# Patient Record
Sex: Male | Born: 1995 | Race: Black or African American | Hispanic: No | Marital: Single | Smoking: Never smoker
Health system: Southern US, Community
[De-identification: ages and names within clinical notes are randomized; demographics above are authoritative.]

---
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
## 2021-02-24 ENCOUNTER — Encounter (HOSPITAL_COMMUNITY): Payer: Self-pay | Admitting: Medical Oncology

## 2021-02-24 ENCOUNTER — Ambulatory Visit (INDEPENDENT_AMBULATORY_CARE_PROVIDER_SITE_OTHER): Payer: Self-pay

## 2021-02-24 ENCOUNTER — Other Ambulatory Visit: Payer: Self-pay

## 2021-02-24 ENCOUNTER — Ambulatory Visit (HOSPITAL_COMMUNITY)
Admission: EM | Admit: 2021-02-24 | Discharge: 2021-02-24 | Disposition: A | Discharge status: Home / Self Care | Payer: Self-pay | Attending: Internal Medicine | Admitting: Internal Medicine

## 2021-02-24 DIAGNOSIS — S63287A Dislocation of proximal interphalangeal joint of left little finger, initial encounter: Secondary | ICD-10-CM

## 2021-02-24 DIAGNOSIS — S6992XA Unspecified injury of left wrist, hand and finger(s), initial encounter: Secondary | ICD-10-CM

## 2021-02-24 DIAGNOSIS — S63259A Unspecified dislocation of unspecified finger, initial encounter: Secondary | ICD-10-CM

## 2021-02-24 DIAGNOSIS — Y9379 Activity, other specified sports and athletics: Secondary | ICD-10-CM

## 2021-02-24 MED ORDER — NAPROXEN 500 MG PO TABS: 500.0000 mg | ORAL_TABLET | Freq: Two times a day (BID) | ORAL | 0 refills | Status: DC

## 2021-02-24 MED ORDER — MORPHINE SULFATE (PF) 2 MG/ML IV SOLN: 4.0000 mg | Freq: Once | INTRAVENOUS | Status: DC

## 2021-02-24 NOTE — ED Provider Notes (Signed)
[facility]    CSN: [id number] Arrival date & time: 02/24/21  1740      History   Chief Complaint Chief Complaint  Patient presents with  . Finger Injury    LT small finger    HPI Jack Rubio is a 25 y.o. male.   HPI   Finger Injury: Patient reports that about 15 minutes ago he was playing basketball when he jammed his left pinky finger.  Immediately had 10 out of 10 pain and noticed that his pinky finger did not look aligned.  He has not tried anything for symptoms.  No previous injury to this area.  History reviewed. No pertinent past medical history.  There are no problems to display for this patient.   History reviewed. No pertinent surgical history.     Home Medications    Prior to Admission medications   Not on File    Family History History reviewed. No pertinent family history.  Social History     Allergies   Patient has no known allergies.   Review of Systems Review of Systems  As stated above in HPI Physical Exam Triage Vital Signs ED Triage Vitals  Enc Vitals Group     BP 02/24/21 1756 138/68     Pulse Rate 02/24/21 1756 (!) 102     Resp 02/24/21 1756 18     Temp 02/24/21 1756 98.2 F (36.8 C)     Temp Source 02/24/21 1756 Oral     SpO2 02/24/21 1756 99 %     Weight --      Height --      Head Circumference --      Peak Flow --      Pain Score 02/24/21 1753 4     Pain Loc --      Pain Edu? --      Excl. in GC? --    No data found.  Updated Vital Signs BP 138/68 (BP Location: Right Arm)   Pulse (!) 102   Temp 98.2 F (36.8 C) (Oral)   Resp 18   SpO2 99%   Physical Exam Vitals and nursing note reviewed.  Constitutional:      Appearance: Normal appearance.  Musculoskeletal:     Comments: The left 5th digit is malaligned at the PIP joint.  There is no ecchymosis, edema, skin breakdown or pallor.  Skin:    General: Skin is warm.     Capillary Refill: Capillary refill takes less than 2 seconds.   Neurological:     General: No focal deficit present.     Mental Status: He is alert.     Sensory: No sensory deficit.      UC Treatments / Results  Labs (all labs ordered are listed, but only abnormal results are displayed) Labs Reviewed - No data to display  EKG   Radiology No results found.  Procedures Procedures (including critical care time)  Medications Ordered in UC Medications  morphine 2 MG/ML injection 4 mg (has no administration in time range)    Initial Impression / Assessment and Plan / UC Course  I have reviewed the triage vital signs and the nursing notes.  Pertinent labs & imaging results that were available during my care of the patient were reviewed by me and considered in my medical decision making (see chart for details).     New.  Obtained an image which did confirm a dislocation without any obvious fracture.  We discussed that there is a  higher likelihood of fracture with relocation given the type of dislocation that he has.  It is also possible that the fracture may be hidden given the dislocation. we discussed the procedure with patient and I offered him morphine prior to the procedure however he elected to trial without morphine first.  Gentle traction was applied to the distal finger and the finger relocated into proper alignment.  Patient had full range of motion.  He denied any decrease in sensation, any increase in pain and his capillary refill was less than 2 seconds after procedure.  Updated x-ray pending and he will be splinted.  If a fracture is shown he will need to be seen by orthopedics for follow up and he will keep the splint with buddy tape on for 4-6 weeks.    Final Clinical Impressions(s) / UC Diagnoses   Final diagnoses:  None   Discharge Instructions   None    ED Prescriptions    None     PDMP not reviewed this encounter.   Rushie Chestnut, [place] 02/24/21 1824

## 2021-02-24 NOTE — ED Triage Notes (Signed)
Pt presents with injury to Lt small finger.

## 2021-02-24 NOTE — Discharge Instructions (Addendum)
Please wear the splint until you are seen by orthopedics but likely for 4-6 weeks.

## 2021-04-07 ENCOUNTER — Other Ambulatory Visit: Payer: Self-pay

## 2021-04-07 ENCOUNTER — Encounter: Payer: Self-pay | Admitting: Emergency Medicine

## 2021-04-07 ENCOUNTER — Emergency Department
Admission: EM | Admit: 2021-04-07 | Discharge: 2021-04-07 | Disposition: A | Discharge status: Home / Self Care | Payer: Self-pay | Source: Home / Self Care

## 2021-04-07 DIAGNOSIS — R202 Paresthesia of skin: Secondary | ICD-10-CM

## 2021-04-07 DIAGNOSIS — Z113 Encounter for screening for infections with a predominantly sexual mode of transmission: Secondary | ICD-10-CM

## 2021-04-07 LAB — POCT URINALYSIS DIP (MANUAL ENTRY)
Bilirubin, UA: NEGATIVE
Blood, UA: NEGATIVE
Glucose, UA: NEGATIVE mg/dL
Ketones, POC UA: NEGATIVE mg/dL
Leukocytes, UA: NEGATIVE
Nitrite, UA: NEGATIVE
Protein Ur, POC: NEGATIVE mg/dL
Spec Grav, UA: 1.01 (ref 1.010–1.025)
Urobilinogen, UA: 0.2 E.U./dL
pH, UA: 6.5 (ref 5.0–8.0)

## 2021-04-07 NOTE — Discharge Instructions (Signed)
We have drawn blood for lab testing. These results will be available via MyChart. We will also call you with any abnormal results that require treatment.  Urine testing will be back in about 2-3 days.  Do not have sex until results are back and negative or until treatment is completed, whichever is longer.  Follow up with this office or with primary care if symptoms are persisting.  Follow up in the ER for high fever, trouble swallowing, trouble breathing, other concerning symptoms.

## 2021-04-07 NOTE — ED Provider Notes (Signed)
[facility]    CSN: [id number] Arrival date & time: 04/07/21  1116      History   Chief Complaint Chief Complaint  Patient presents with  . Possible STD    HPI Jack Rubio is a 25 y.o. male.   Reports that he is feeling tingling at the tip of his penis. Reports in the past that he has also had a painless sore to the end of his urethra. Denies any current lesions. Reports recent unprotected sex with one male partner. Reports that he did experience some burning to his penis after sex with this partner. Declines current burning, dysuria, hematuria, penile discharge, fever, chills, abdominal pain, other symptoms. Denies previous symptoms. Denies being told of known exposure to STI.  ROS per HPI  The history is provided by the patient.    History reviewed. No pertinent past medical history.  There are no problems to display for this patient.   History reviewed. No pertinent surgical history.     Home Medications    Prior to Admission medications   Medication Sig Start Date End Date Taking? Authorizing Provider  naproxen (NAPROSYN) 500 MG tablet Take 1 tablet (500 mg total) by mouth 2 (two) times daily. 02/24/21   Rushie Chestnut, PA-C    Family History No family history on file.  Social History Social History   Tobacco Use  . Smoking status: Never Smoker  . Smokeless tobacco: Never Used     Allergies   Patient has no known allergies.   Review of Systems Review of Systems   Physical Exam Triage Vital Signs ED Triage Vitals  Enc Vitals Group     BP 04/07/21 1131 108/69     Pulse Rate 04/07/21 1131 75     Resp --      Temp --      Temp src --      SpO2 04/07/21 1131 99 %     Weight --      Height --      Head Circumference --      Peak Flow --      Pain Score 04/07/21 1132 0     Pain Loc --      Pain Edu? --      Excl. in GC? --    No data found.  Updated Vital Signs BP 108/69 (BP Location: Left Arm)   Pulse 75   SpO2  99%   Visual Acuity Right Eye Distance:   Left Eye Distance:   Bilateral Distance:    Right Eye Near:   Left Eye Near:    Bilateral Near:     Physical Exam Vitals and nursing note reviewed.  Constitutional:      General: He is not in acute distress.    Appearance: Normal appearance. He is well-developed. He is not ill-appearing.  HENT:     Head: Normocephalic and atraumatic.  Eyes:     Extraocular Movements: Extraocular movements intact.     Conjunctiva/sclera: Conjunctivae normal.     Pupils: Pupils are equal, round, and reactive to light.  Cardiovascular:     Rate and Rhythm: Normal rate and regular rhythm.     Heart sounds: No murmur heard.   Pulmonary:     Effort: Pulmonary effort is normal. No respiratory distress.     Breath sounds: Normal breath sounds.  Abdominal:     Palpations: Abdomen is soft.     Tenderness: There is no abdominal tenderness.  Genitourinary:  Comments: Deferred  Musculoskeletal:        General: Normal range of motion.     Cervical back: Normal range of motion and neck supple.  Skin:    General: Skin is warm and dry.     Capillary Refill: Capillary refill takes less than 2 seconds.  Neurological:     General: No focal deficit present.     Mental Status: He is alert and oriented to person, place, and time.  Psychiatric:        Mood and Affect: Mood normal.        Behavior: Behavior normal.        Thought Content: Thought content normal.      UC Treatments / Results  Labs (all labs ordered are listed, but only abnormal results are displayed) Labs Reviewed  C. TRACHOMATIS/N. GONORRHOEAE RNA  RPR  HIV ANTIBODY (ROUTINE TESTING W REFLEX)  HSV(HERPES SIMPLEX VRS) I + II AB-IGG  POCT URINALYSIS DIP (MANUAL ENTRY)    EKG   Radiology No results found.  Procedures Procedures (including critical care time)  Medications Ordered in UC Medications - No data to display  Initial Impression / Assessment and Plan / UC Course  I  have reviewed the triage vital signs and the nursing notes.  Pertinent labs & imaging results that were available during my care of the patient were reviewed by me and considered in my medical decision making (see chart for details).    Tingling Sensation to penis STI Screen  Urine GC/Chlamydia obtained UA negative for infection HIV/RPR/HSV drawn in office today Will be in touch with any abnormal results that require treatment Do not have sex until results are back and negative or until treatment is completed, whichever is longer Follow up with this office or with primary care if symptoms are persisting.  Follow up in the ER for high fever, trouble swallowing, trouble breathing, other concerning symptoms.   Final Clinical Impressions(s) / UC Diagnoses   Final diagnoses:  Routine screening for STI (sexually transmitted infection)  Tingling sensation     Discharge Instructions     We have drawn blood for lab testing. These results will be available via MyChart. We will also call you with any abnormal results that require treatment.  Urine testing will be back in about 2-3 days.  Do not have sex until results are back and negative or until treatment is completed, whichever is longer.  Follow up with this office or with primary care if symptoms are persisting.  Follow up in the ER for high fever, trouble swallowing, trouble breathing, other concerning symptoms.     ED Prescriptions    None     PDMP not reviewed this encounter.   Moshe Cipro, NP 04/07/21 1257

## 2021-04-07 NOTE — ED Triage Notes (Signed)
Patient feels that he has a possible STD.  He is having "tingling" feeling.  Patient is not having any dysuria or discharge.  Patient has had both protected and unprotected sex with 2 different partners.  STD testing in December, never received results.

## 2021-04-08 LAB — HIV ANTIBODY (ROUTINE TESTING W REFLEX): HIV 1&2 Ab, 4th Generation: NONREACTIVE

## 2021-04-08 LAB — C. TRACHOMATIS/N. GONORRHOEAE RNA
C. trachomatis RNA, TMA: NOT DETECTED
N. gonorrhoeae RNA, TMA: NOT DETECTED

## 2021-04-08 LAB — HSV(HERPES SIMPLEX VRS) I + II AB-IGG
HAV 1 IGG,TYPE SPECIFIC AB: 10.9 index — ABNORMAL HIGH
HSV 2 IGG,TYPE SPECIFIC AB: <0.9 index

## 2021-04-08 LAB — RPR: RPR Ser Ql: NONREACTIVE

## 2022-04-09 IMAGING — DX DG FINGER LITTLE 2+V*L*
3 series · 3 of 3 positions shown · non-contrast
Comparison: None.

CLINICAL DATA: Sports injury

EXAM:
LEFT LITTLE FINGER 2+V

[finger ap]
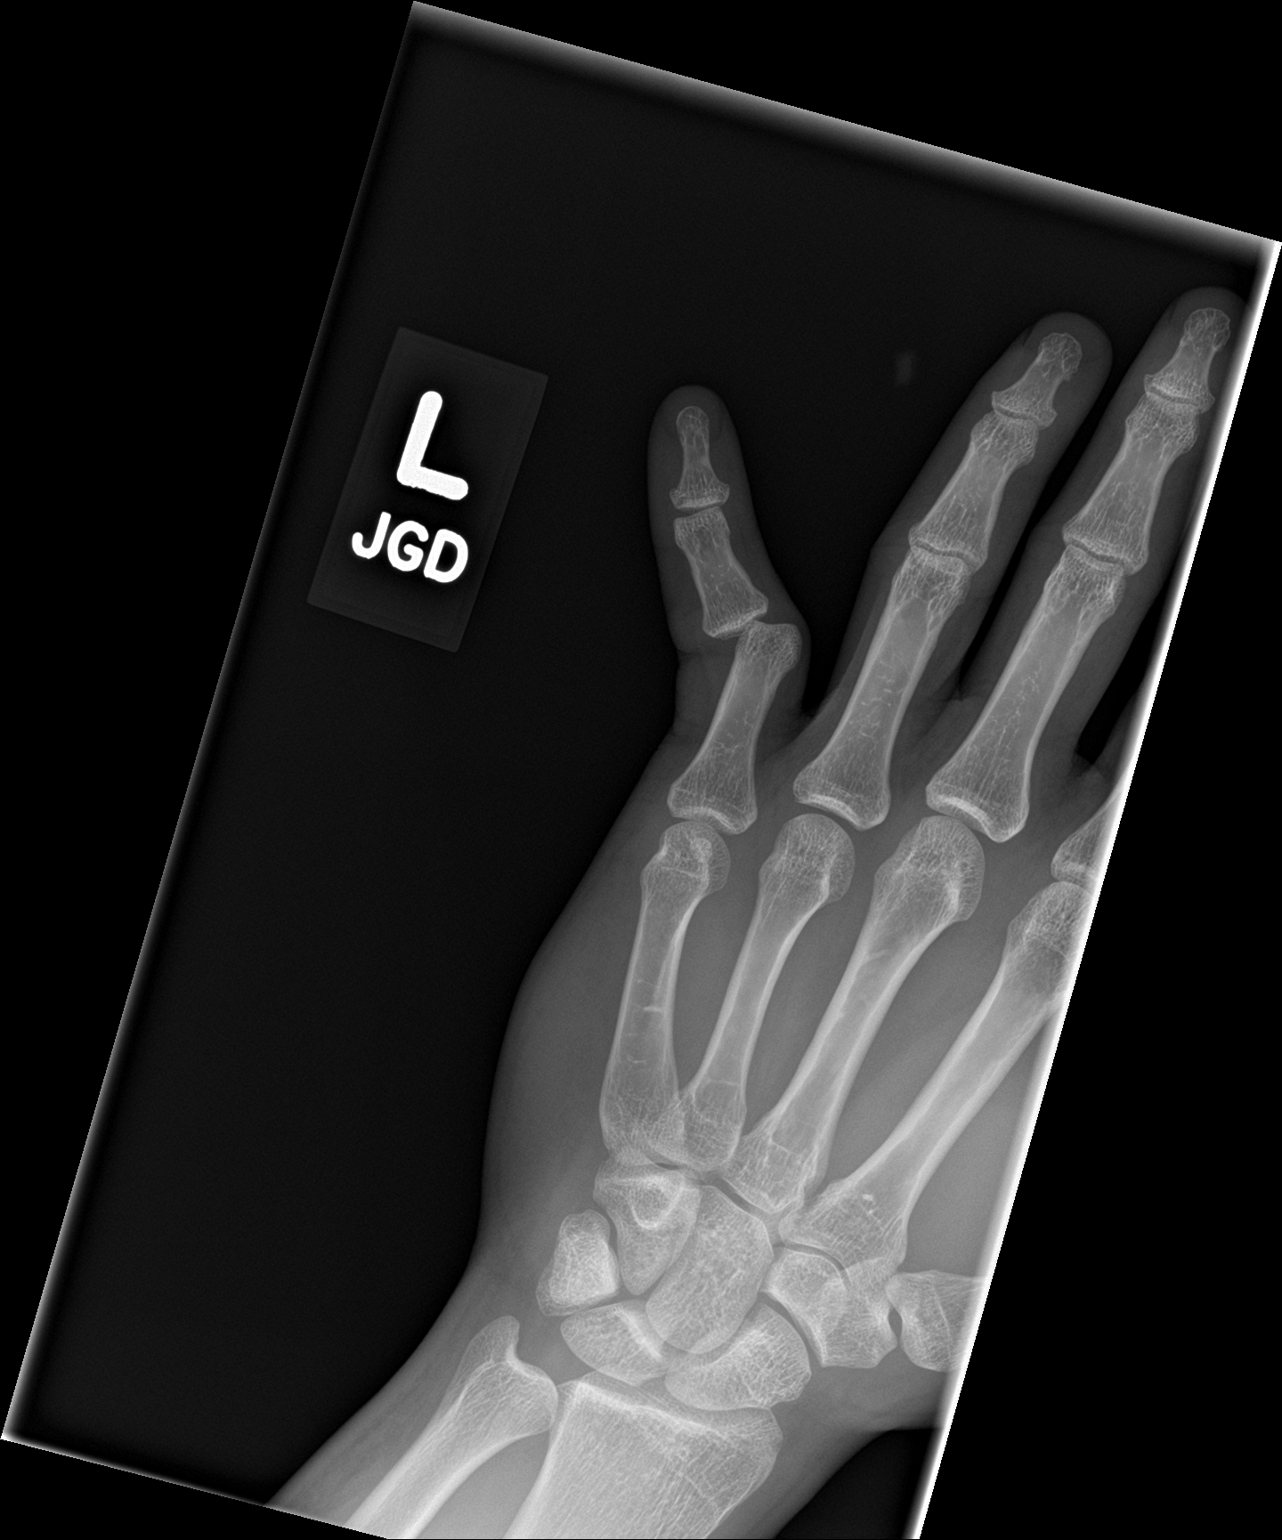

[finger obl]
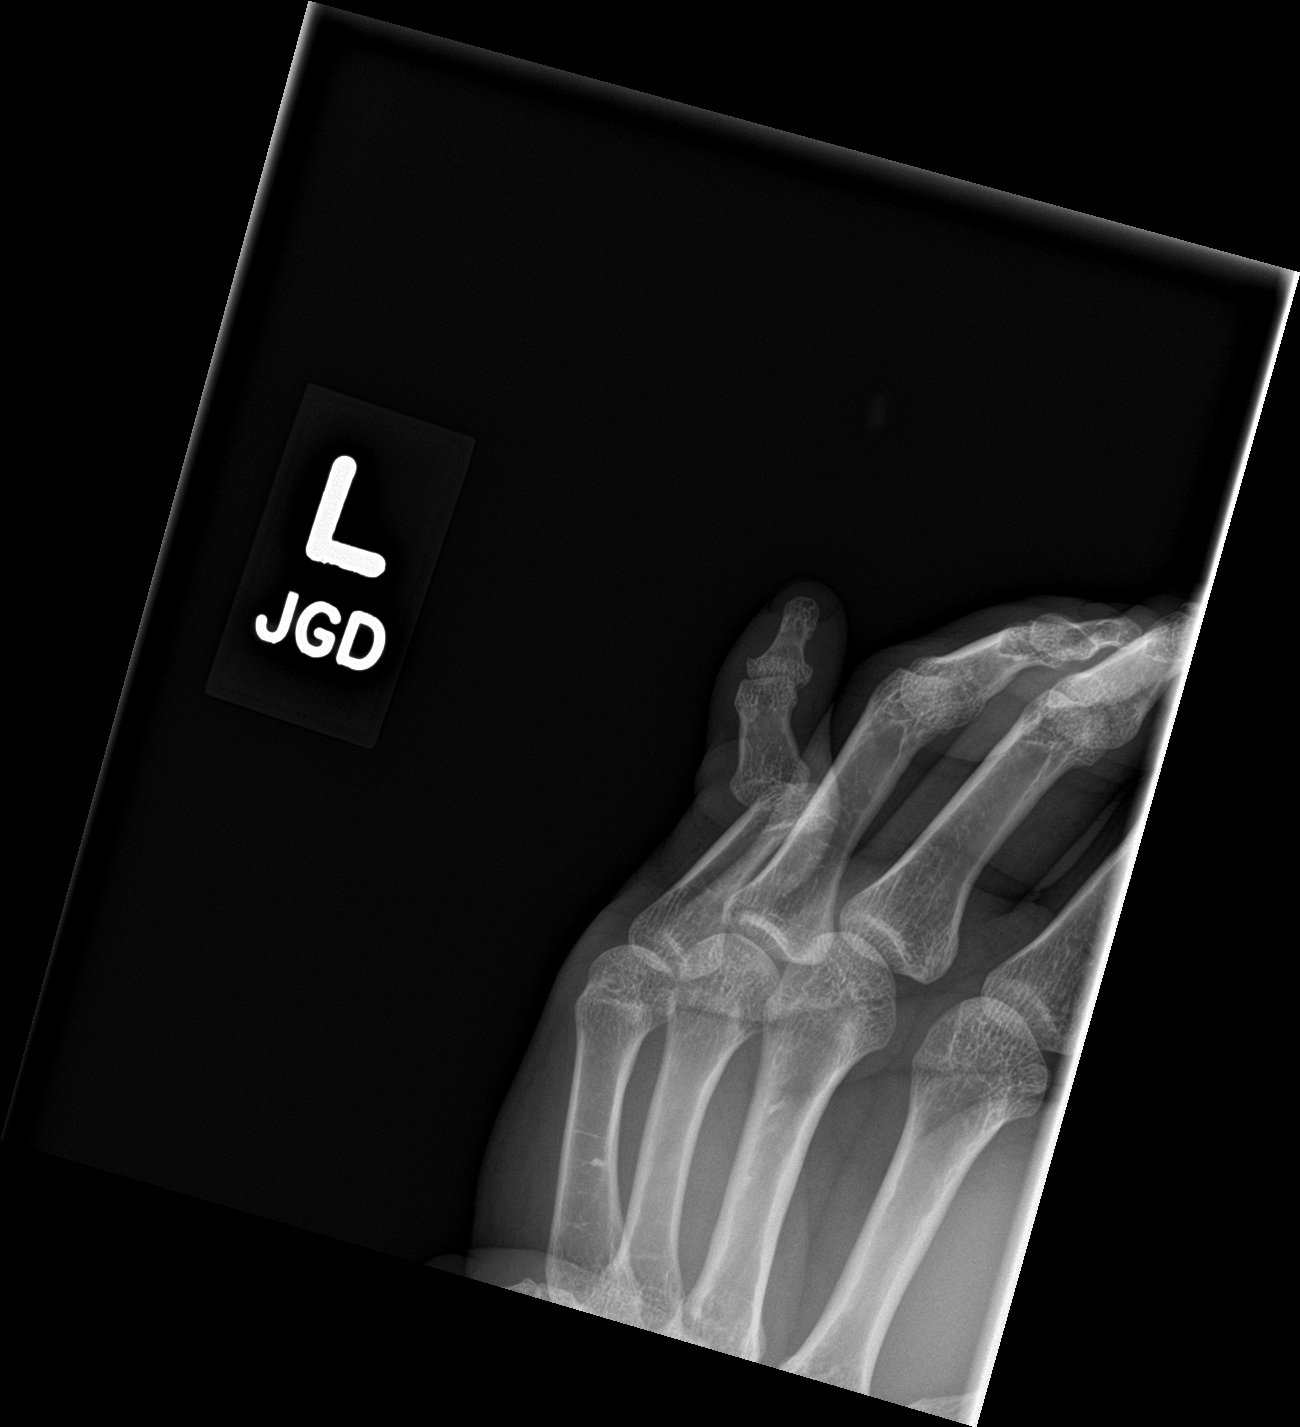

[finger lat]
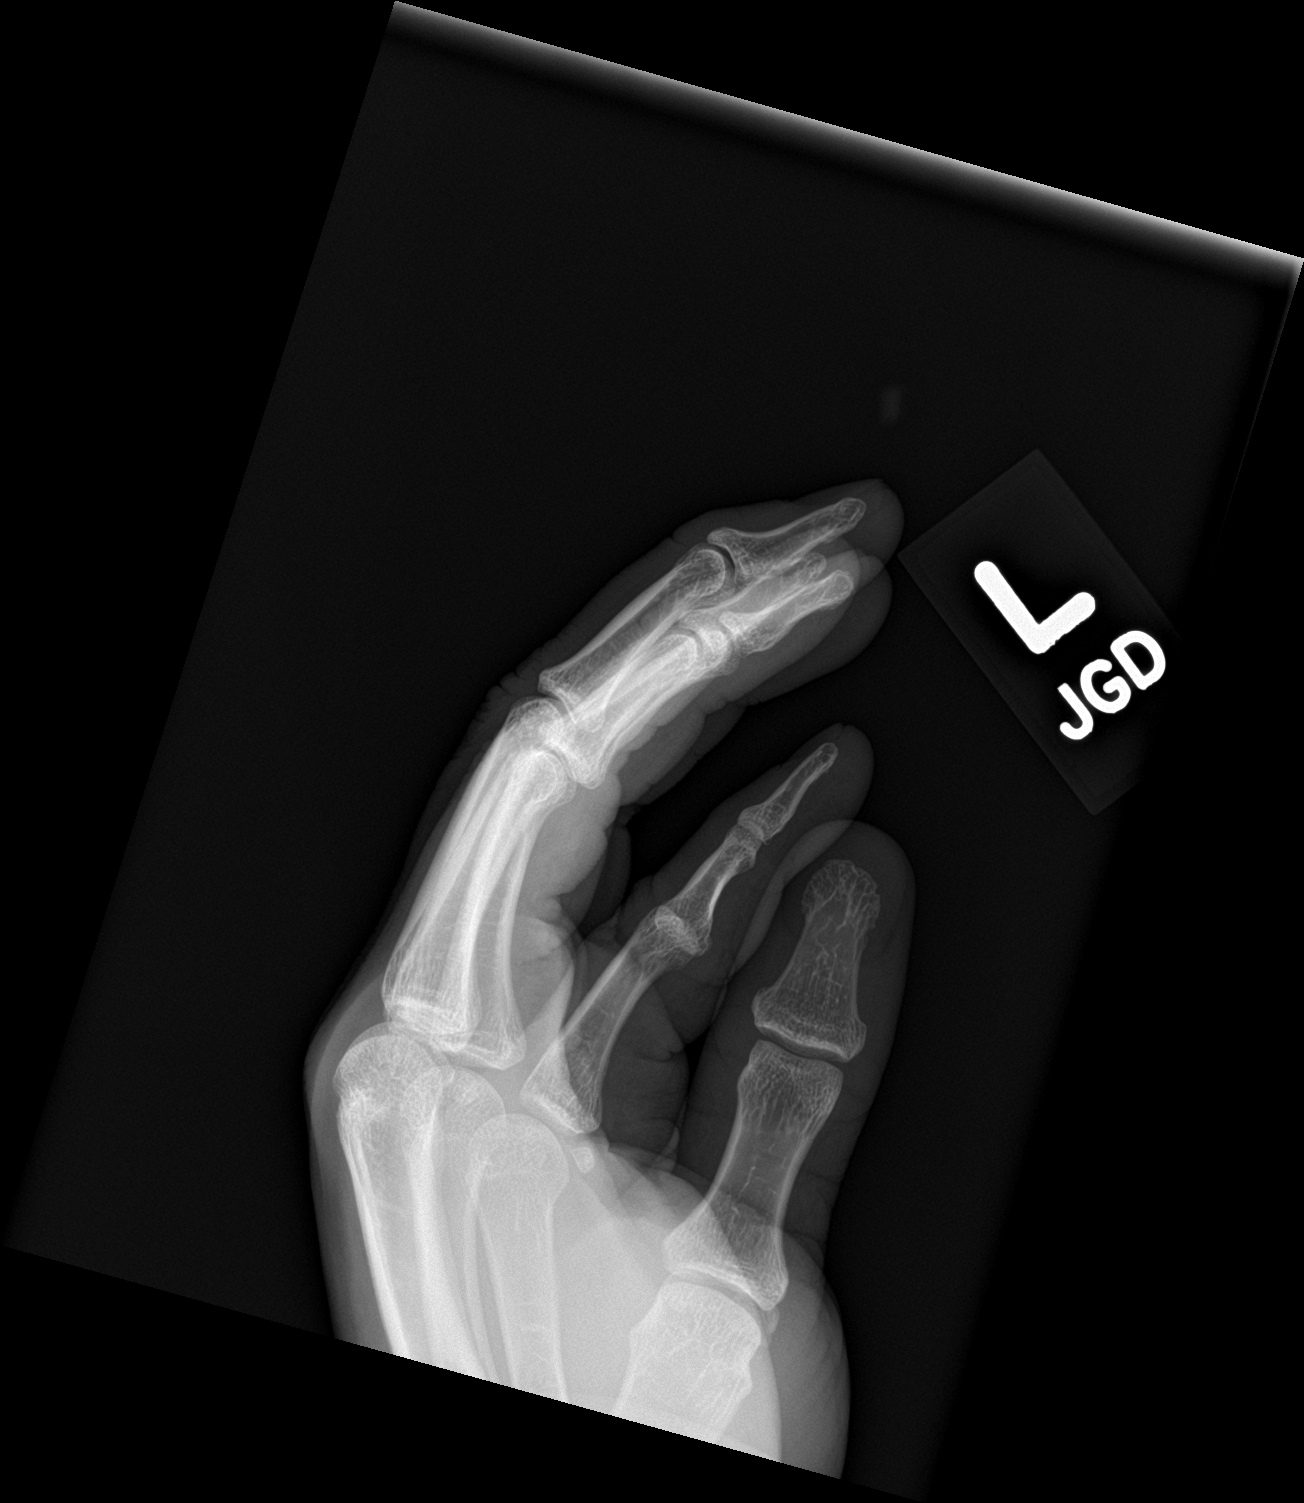

[3 of 3 positions shown; findings below may reference images not displayed]

FINDINGS: Dislocation of the fifth digit at the proximal interphalangeal
joint. Ulnar displacement. No fracture.
IMPRESSION: Dislocation of the fifth digit at the PIP.

## 2022-04-09 IMAGING — DX DG FINGER LITTLE 2+V*L*
3 series · 3 of 3 positions shown · non-contrast
Comparison: Pre reduction radiograph earlier today.

CLINICAL DATA: Finger dislocation, postreduction.

EXAM:
LEFT LITTLE FINGER 2+V

[finger ap]
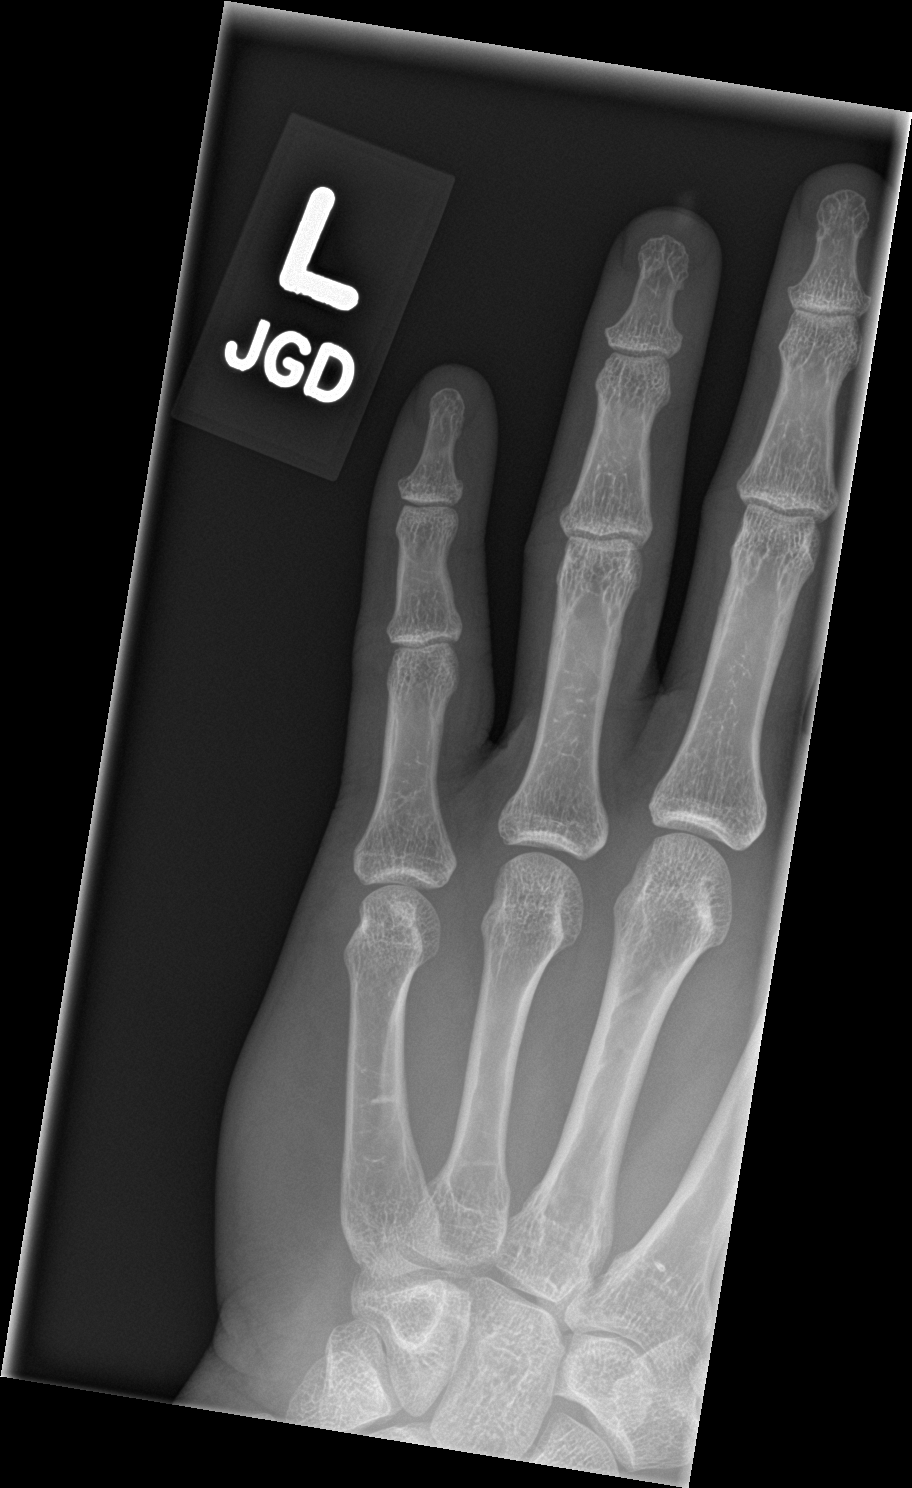

[finger obl]
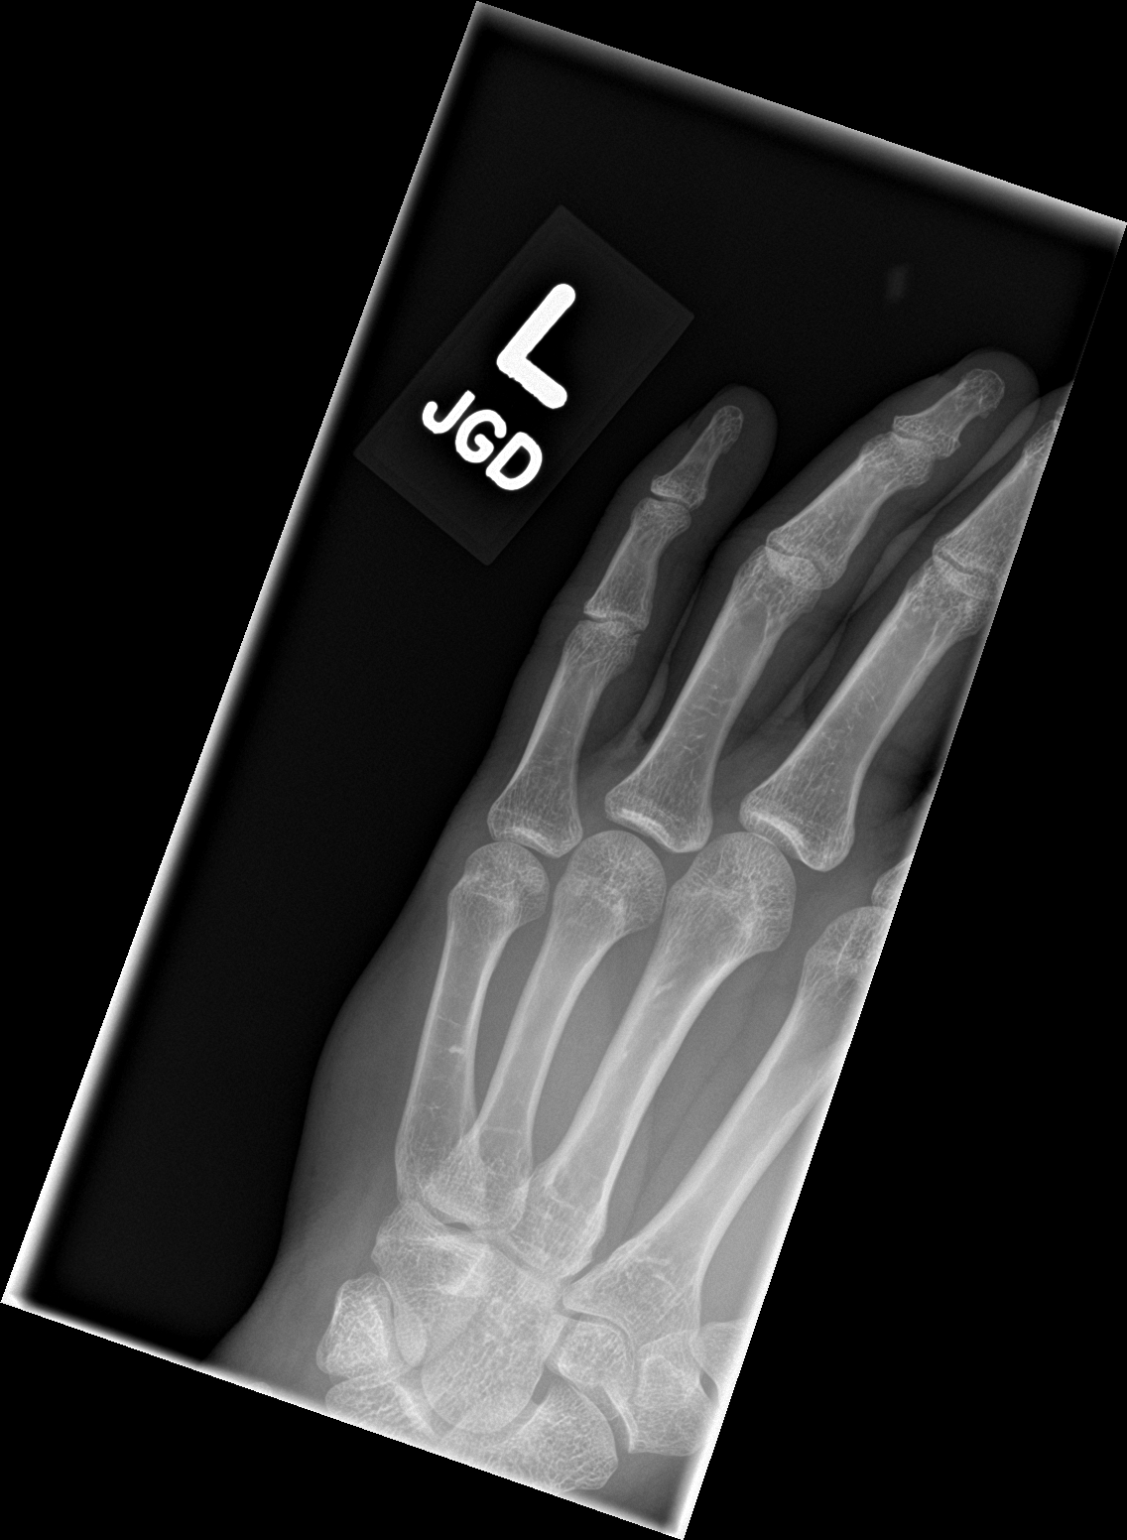

[finger lat]
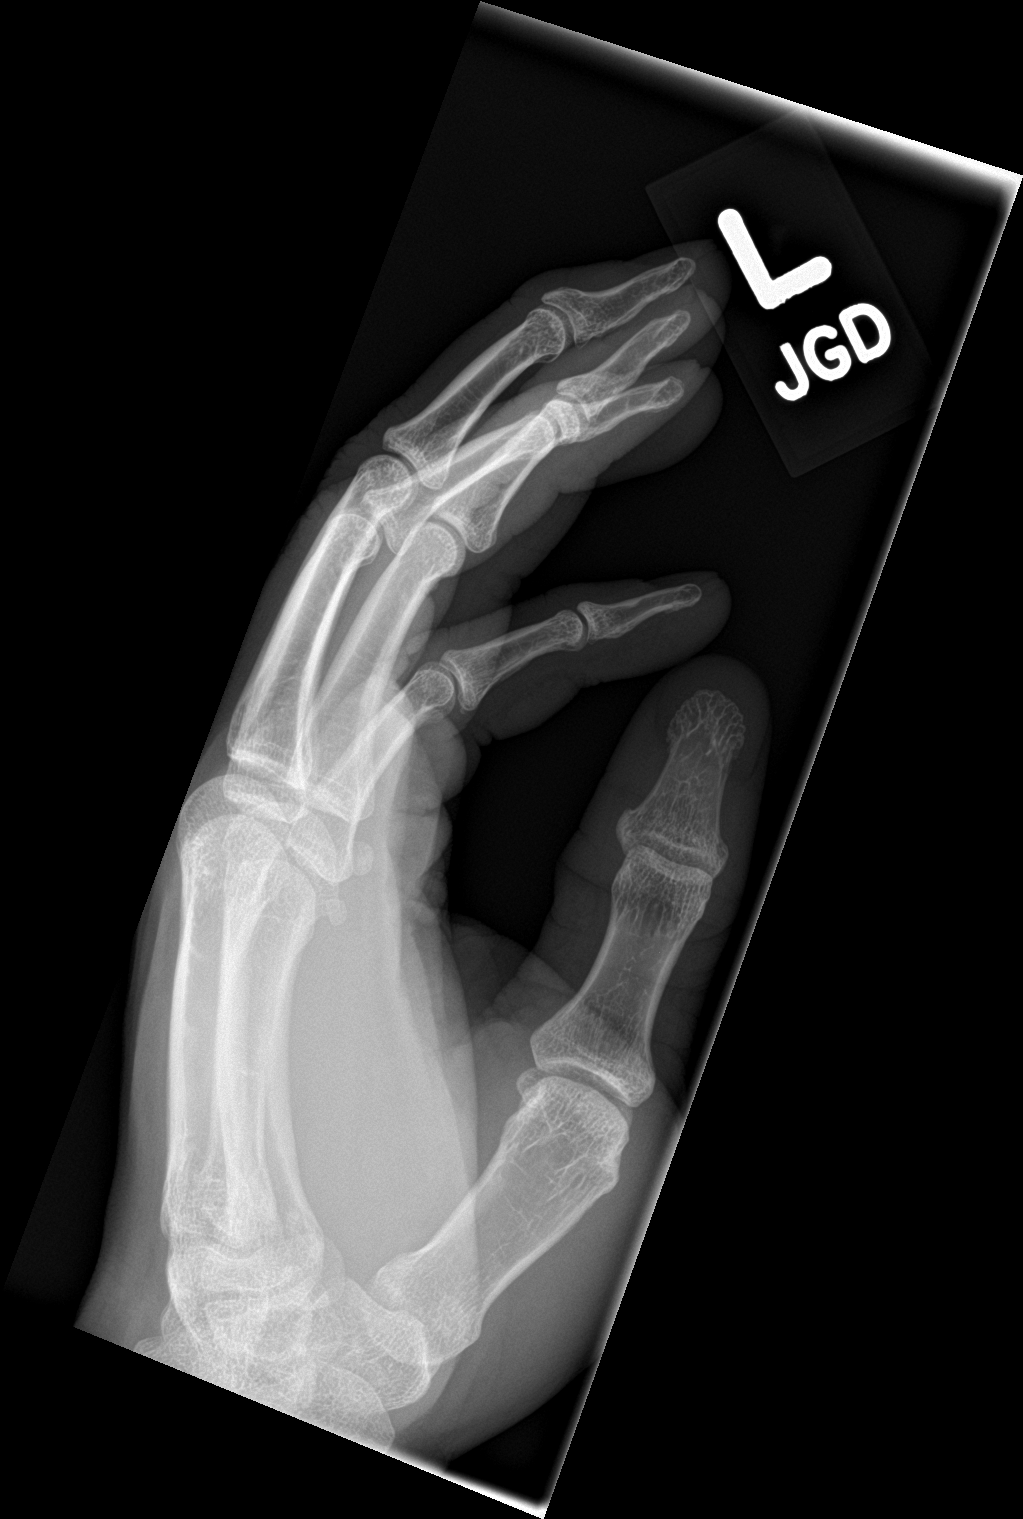

[3 of 3 positions shown; findings below may reference images not displayed]

FINDINGS: Normal alignment of the pinky finger proximal interphalangeal joint
postreduction. No visualized fracture. Remainder of the included
hand is intact.
IMPRESSION: Successful reduction of the pinky finger proximal interphalangeal
joint dislocation. No visualized fracture.

## 2022-05-12 ENCOUNTER — Emergency Department
Admission: EM | Admit: 2022-05-12 | Discharge: 2022-05-12 | Disposition: A | Discharge status: Home / Self Care | Payer: BLUE CROSS/BLUE SHIELD | Source: Home / Self Care

## 2022-05-12 DIAGNOSIS — J069 Acute upper respiratory infection, unspecified: Secondary | ICD-10-CM

## 2022-05-12 MED ORDER — AMOXICILLIN-POT CLAVULANATE 875-125 MG PO TABS: 1.0000 | ORAL_TABLET | Freq: Two times a day (BID) | ORAL | 0 refills | Status: AC

## 2022-05-12 MED ORDER — HYDROCOD POLI-CHLORPHE POLI ER 10-8 MG/5ML PO SUER: 5.0000 mL | Freq: Two times a day (BID) | ORAL | 0 refills | Status: AC | PRN

## 2022-05-12 MED ORDER — PREDNISONE 20 MG PO TABS: 40.0000 mg | ORAL_TABLET | Freq: Every day | ORAL | 0 refills | Status: AC

## 2022-05-12 NOTE — Discharge Instructions (Signed)
Continue to drink lots of water Take the antibiotic 2 times a day for 1 week Take the prednisone once a day for 5 days I have prescribed a stronger cough medicine to take once every 12 hours so that you can sleep  See your doctor if not improving by the end of the week

## 2022-05-12 NOTE — ED Triage Notes (Signed)
Pt c/o cough and sore throat x 1 week. Says lost voice in last couple days. Nose bleed this am. Cough has become non productive. Was taking allergy meds but felt worse after so stopped.
